# Patient Record
Sex: Female | Born: 1965 | Race: Black or African American | Hispanic: No | Marital: Married | Smoking: Never smoker
Health system: Southern US, Community
[De-identification: ages and names within clinical notes are randomized; demographics above are authoritative.]

---

## 1998-09-10 ENCOUNTER — Other Ambulatory Visit: Admission: RE | Admit: 1998-09-10 | Discharge: 1998-09-10 | Payer: Self-pay | Admitting: *Deleted

## 1999-03-01 HISTORY — PX: ABDOMINAL HYSTERECTOMY: SHX81

## 2000-03-01 ENCOUNTER — Other Ambulatory Visit: Admission: RE | Admit: 2000-03-01 | Discharge: 2000-03-01 | Payer: Self-pay | Admitting: Obstetrics and Gynecology

## 2000-03-21 ENCOUNTER — Other Ambulatory Visit: Admission: RE | Admit: 2000-03-21 | Discharge: 2000-03-21 | Payer: Self-pay | Admitting: Obstetrics and Gynecology

## 2000-03-31 ENCOUNTER — Inpatient Hospital Stay (HOSPITAL_COMMUNITY): Admission: AD | Admit: 2000-03-31 | Discharge: 2000-03-31 | Payer: Self-pay | Admitting: Obstetrics and Gynecology

## 2001-01-09 ENCOUNTER — Observation Stay (HOSPITAL_COMMUNITY): Admission: RE | Admit: 2001-01-09 | Discharge: 2001-01-10 | Payer: Self-pay | Admitting: Obstetrics and Gynecology

## 2001-12-17 ENCOUNTER — Other Ambulatory Visit: Admission: RE | Admit: 2001-12-17 | Discharge: 2001-12-17 | Payer: Self-pay | Admitting: Obstetrics and Gynecology

## 2004-08-25 ENCOUNTER — Ambulatory Visit: Payer: Self-pay | Admitting: Internal Medicine

## 2008-12-12 ENCOUNTER — Ambulatory Visit: Payer: Self-pay | Admitting: Internal Medicine

## 2013-08-09 ENCOUNTER — Ambulatory Visit: Payer: Self-pay | Admitting: Physician Assistant

## 2014-05-01 ENCOUNTER — Ambulatory Visit: Payer: Self-pay | Admitting: Emergency Medicine

## 2018-03-20 ENCOUNTER — Other Ambulatory Visit: Payer: Self-pay

## 2018-03-20 ENCOUNTER — Ambulatory Visit
Admission: EM | Admit: 2018-03-20 | Discharge: 2018-03-20 | Disposition: A | Discharge status: Home / Self Care | Payer: Self-pay | Attending: Family Medicine | Admitting: Family Medicine

## 2018-03-20 ENCOUNTER — Encounter: Payer: Self-pay | Admitting: Emergency Medicine

## 2018-03-20 ENCOUNTER — Emergency Department: Payer: Self-pay

## 2018-03-20 ENCOUNTER — Emergency Department
Admission: EM | Admit: 2018-03-20 | Discharge: 2018-03-20 | Disposition: A | Discharge status: Home / Self Care | Payer: Self-pay | Attending: Emergency Medicine | Admitting: Emergency Medicine

## 2018-03-20 DIAGNOSIS — B353 Tinea pedis: Secondary | ICD-10-CM | POA: Insufficient documentation

## 2018-03-20 DIAGNOSIS — S91301A Unspecified open wound, right foot, initial encounter: Secondary | ICD-10-CM | POA: Insufficient documentation

## 2018-03-20 DIAGNOSIS — L03115 Cellulitis of right lower limb: Secondary | ICD-10-CM | POA: Insufficient documentation

## 2018-03-20 LAB — BASIC METABOLIC PANEL
ANION GAP: 7 (ref 5–15)
BUN: 11 mg/dL (ref 6–20)
CALCIUM: 9.2 mg/dL (ref 8.9–10.3)
CO2: 28 mmol/L (ref 22–32)
CREATININE: 0.9 mg/dL (ref 0.44–1.00)
Chloride: 106 mmol/L (ref 98–111)
GFR calc Af Amer: >60 mL/min (ref >60)
GLUCOSE: 97 mg/dL (ref 70–99)
Potassium: 3.5 mmol/L (ref 3.5–5.1)
Sodium: 141 mmol/L (ref 135–145)

## 2018-03-20 LAB — CBC WITH DIFFERENTIAL/PLATELET
Abs Immature Granulocytes: 0.01 10*3/uL (ref 0.00–0.07)
BASOS ABS: 0 10*3/uL (ref 0.0–0.1)
BASOS PCT: 0 %
Eosinophils Absolute: 0.1 10*3/uL (ref 0.0–0.5)
Eosinophils Relative: 2 %
HCT: 39.8 % (ref 36.0–46.0)
Hemoglobin: 12.3 g/dL (ref 12.0–15.0)
IMMATURE GRANULOCYTES: 0 %
Lymphocytes Relative: 31 %
Lymphs Abs: 1.8 10*3/uL (ref 0.7–4.0)
MCH: 26.6 pg (ref 26.0–34.0)
MCHC: 30.9 g/dL (ref 30.0–36.0)
MCV: 86.1 fL (ref 80.0–100.0)
MONOS PCT: 6 %
Monocytes Absolute: 0.3 10*3/uL (ref 0.1–1.0)
NEUTROS PCT: 61 %
NRBC: 0 % (ref 0.0–0.2)
Neutro Abs: 3.5 10*3/uL (ref 1.7–7.7)
PLATELETS: 423 10*3/uL — AB (ref 150–400)
RBC: 4.62 MIL/uL (ref 3.87–5.11)
RDW: 13.3 % (ref 11.5–15.5)
WBC: 5.8 10*3/uL (ref 4.0–10.5)

## 2018-03-20 LAB — URIC ACID: Uric Acid, Serum: 6.3 mg/dL (ref 2.5–7.1)

## 2018-03-20 MED ORDER — SULFAMETHOXAZOLE-TRIMETHOPRIM 800-160 MG PO TABS
1.0000 | ORAL_TABLET | Freq: Once | ORAL | Status: AC
  Administered 2018-03-20: 1 via ORAL
  Filled 2018-03-20: qty 1

## 2018-03-20 MED ORDER — CLOTRIMAZOLE 1 % EX CREA
TOPICAL_CREAM | Freq: Two times a day (BID) | CUTANEOUS | Status: DC
  Filled 2018-03-20: qty 15

## 2018-03-20 MED ORDER — SULFAMETHOXAZOLE-TRIMETHOPRIM 800-160 MG PO TABS: 1.0000 | ORAL_TABLET | Freq: Two times a day (BID) | ORAL | 0 refills | Status: AC

## 2018-03-20 MED ORDER — CLOTRIMAZOLE 1 % EX CREA: 1.0000 "application " | TOPICAL_CREAM | Freq: Two times a day (BID) | CUTANEOUS | 0 refills | Status: AC

## 2018-03-20 NOTE — ED Triage Notes (Signed)
Here for right foot.  Has had pain to right foot for 1 week and drainage for 1 day.  Sent by urgent care for infection.  No fevers.  Per pt drainage is white.  Pt is not diabetic.  Pt has redness to right great toe. Drainage between first and second toe on right foot. Serous type drainage noted.  Mild warmth to red area.  No history of gout.

## 2018-03-20 NOTE — ED Triage Notes (Signed)
Patient complains of right big toe pain and 2nd toe pain. Patient states that this started 1 week ago. Patient states that area has been swollen and painful.

## 2018-03-20 NOTE — Discharge Instructions (Addendum)
Go directly to emergency room as discussed.  °

## 2018-03-20 NOTE — ED Provider Notes (Signed)
[facility] ____________________________________________  Time seen: Approximately 11:26 AM  I have reviewed the triage vital signs and the nursing notes.   HISTORY  Chief Complaint Toe Pain   HPI Laura Gardner is a 53 y.o. female presenting with family at bedside for evaluation of right foot pain and redness present for approximately 1 week.  Patient reports this has been gradually increasing over the last day or 2.  States they have been soaking in Epson salt without change.  Denies other alleviating measures.  Denies fall, injury or direct trauma.  Denies any known break in skin.  Denies drainage.  No accompanying fevers.  States has occasionally had some tingling sensation coming up her right shin but no pain to the upper leg.  States she has bilateral lower extremity chronic edema without any change in her chronic swelling.  Denies recent sickness.  Denies chest pain, shortness of breath.  Possible history of MRSA per patient.  Denies other skin changes.  Denies history of diabetes  No PCP    History reviewed. No pertinent past medical history.  There are no active problems to display for this patient.   Past Surgical History:  Procedure Laterality Date  . ABDOMINAL HYSTERECTOMY  2001     No current facility-administered medications for this encounter.  No current outpatient medications on file.  Allergies Patient has no known allergies.  Family History  Problem Relation Age of Onset  . Arthritis Mother   . Sleep apnea Father   . Hypertension Father     Social History Social History   Tobacco Use  . Smoking status: Never Smoker  . Smokeless tobacco: Never Used  Substance Use Topics  . Alcohol use: Not Currently  . Drug use: Not Currently    Review of Systems Constitutional: No fever Cardiovascular: Denies chest pain. Respiratory: Denies shortness of breath. Musculoskeletal: Negative for back pain. Skin: Redness right  foot  ____________________________________________   PHYSICAL EXAM:  VITAL SIGNS: ED Triage Vitals  Enc Vitals Group     BP 03/20/18 1055 (!) 169/99     Pulse Rate 03/20/18 1055 79     Resp 03/20/18 1055 18     Temp 03/20/18 1055 98.1 F (36.7 C)     Temp Source 03/20/18 1055 Oral     SpO2 03/20/18 1055 100 %     Weight 03/20/18 1053 (!) 330 lb (149.7 kg)     Height 03/20/18 1053 5\' 6"  (1.676 m)     Head Circumference --      Peak Flow --      Pain Score 03/20/18 1052 10     Pain Loc --      Pain Edu? --      Excl. in GC? --     Constitutional: Alert and oriented. Well appearing and in no acute distress. ENT      Head: Normocephalic and atraumatic. Cardiovascular: Normal rate, regular rhythm. Grossly normal heart sounds.  Good peripheral circulation. Respiratory: Normal respiratory effort without tachypnea nor retractions. Breath sounds are clear and equal bilaterally. No wheezes, rales, rhonchi. Gastrointestinal: Soft and nontender. No distention. Normal Bowel sounds. No CVA tenderness. Musculoskeletal: Ambulates with cane. Except: Right dorsal distal foot at base of first and second toe area of moderate erythema with localized edema, maceration noted between toes and plantar aspect of foot active purulent drainage present with diffuse moderate tenderness to the distal foot, normal sensation per patient, right ankle and right lower extremity otherwise nontender.  Bilateral  lower extremity edema noted. Neurologic:  Normal speech and language. Speech is normal.  Skin:  Skin is warm, dry Psychiatric: Mood and affect are normal. Speech and behavior are normal. Patient exhibits appropriate insight and judgment   ___________________________________________   LABS (all labs ordered are listed, but only abnormal results are displayed)  Labs Reviewed - No data to display   PROCEDURES Procedures    INITIAL IMPRESSION / ASSESSMENT AND PLAN / ED COURSE  Pertinent labs &  imaging results that were available during my care of the patient were reviewed by me and considered in my medical decision making (see chart for details).  Patient with right foot pain, appearance of cellulitis and concern for deeper wound and infection.  Due to appearance recommend further evaluation and treatment in emergency room at this time.  Patient agrees with this plan and reports family will take her to [facility]. ____________________________________________   FINAL CLINICAL IMPRESSION(S) / ED DIAGNOSES  Final diagnoses:  Open wound of right foot, initial encounter  Cellulitis of right foot     ED Discharge Orders    None       Note: This dictation was prepared with Dragon dictation along with smaller phrase technology. Any transcriptional errors that result from this process are unintentional.         Renford Dills, NP 03/20/18 1130

## 2018-03-20 NOTE — ED Notes (Signed)
AAOx3.  Skin warm and dry. nAD 

## 2018-03-20 NOTE — ED Triage Notes (Signed)
Sent for draining toe.

## 2018-03-27 NOTE — ED Provider Notes (Signed)
[facility] Emergency Department Provider Note   None    (approximate)  I have reviewed the triage vital signs and the nursing notes.   HISTORY  Chief Complaint Foot Problem    HPI Laura Gardner is a 53 y.o. female with history of chronic lower extremity edema presents to the emergency department secondary to right foot pain and redness times approximately 1 week.  Patient states that symptoms have progressively worsened over the past 2 days which prompted her visit to the urgent care earlier today.  Patient was referred to the emergency department from urgent care secondary to beforementioned.  Patient states that she has been soaking her foot in Epson salt without any relief.  Patient denies any trauma   Past medical history Chronic lower extremity swelling There are no active problems to display for this patient.   Past Surgical History:  Procedure Laterality Date  . ABDOMINAL HYSTERECTOMY  2001    Prior to Admission medications   Medication Sig Start Date End Date Taking? Authorizing Provider  clotrimazole (LOTRIMIN) 1 % cream Apply 1 application topically 2 (two) times daily. 03/20/18   Darci CurrentBrown, [place] N, MD  sulfamethoxazole-trimethoprim (BACTRIM DS,SEPTRA DS) 800-160 MG tablet Take 1 tablet by mouth 2 (two) times daily for 10 days. 03/20/18 03/30/18  Darci CurrentBrown, [place] N, MD    Allergies Patient has no known allergies.  Family History  Problem Relation Age of Onset  . Arthritis Mother   . Sleep apnea Father   . Hypertension Father     Social History Social History   Tobacco Use  . Smoking status: Never Smoker  . Smokeless tobacco: Never Used  Substance Use Topics  . Alcohol use: Not Currently  . Drug use: Not Currently    Review of Systems Constitutional: No fever/chills Eyes: No visual changes. ENT: No sore throat. Cardiovascular: Denies chest pain. Respiratory: Denies shortness of breath. Gastrointestinal: No  abdominal pain.  No nausea, no vomiting.  No diarrhea.  No constipation. Genitourinary: Negative for dysuria. Musculoskeletal: Negative for neck pain.  Negative for back pain.  Positive for right foot pain swelling and redness Integumentary: Negative for rash. Neurological: Negative for headaches, focal weakness or numbness.   ____________________________________________   PHYSICAL EXAM:  VITAL SIGNS: ED Triage Vitals  Enc Vitals Group     BP 03/20/18 1311 (!) 151/91     Pulse Rate 03/20/18 1432 74     Resp 03/20/18 1432 16     Temp 03/20/18 1311 98.1 F (36.7 C)     Temp Source 03/20/18 1311 Oral     SpO2 03/20/18 1432 100 %     Weight 03/20/18 1310 (!) 149.7 kg (330 lb)     Height 03/20/18 1310 1.676 m (5\' 6" )     Head Circumference --      Peak Flow --      Pain Score 03/20/18 1310 10     Pain Loc --      Pain Edu? --      Excl. in GC? --     Constitutional: Alert and oriented. Well appearing and in no acute distress. Eyes: Conjunctivae are normal.  Mouth/Throat: Mucous membranes are moist.  Oropharynx non-erythematous. Neck: No stridor.  Cardiovascular: Normal rate, regular rhythm. Good peripheral circulation. Grossly normal heart sounds. Respiratory: Normal respiratory effort.  No retractions. Lungs CTAB. Gastrointestinal: Soft and nontender. No distention.  Musculoskeletal: Edema dorsal aspect of the right foot predominantly at the base of the first and second  toe with associated blanching erythema.  Macerated skin noted between the toes Neurologic:  Normal speech and language. No gross focal neurologic deficits are appreciated.  Skin: Edema dorsal aspect of the right foot with associated blanching erythema maceration noted between the toes.  ____________________________________________   LABS (all labs ordered are listed, but only abnormal results are displayed)  Labs Reviewed  CBC WITH DIFFERENTIAL/PLATELET - Abnormal; Notable for the following components:       Result Value   Platelets 423 (*)    All other components within normal limits  BASIC METABOLIC PANEL  URIC ACID   __________________  RADIOLOGY I, Hoyt N , personally viewed and evaluated these images (plain radiographs) as part of my medical decision making, as well as reviewing the written report by the radiologist.  ED MD interpretation: No acute osseous abnormality noted on right foot x-ray per radiologist.  Official radiology report(s): No results found.    Procedures   ____________________________________________   INITIAL IMPRESSION / ASSESSMENT AND PLAN / ED COURSE  As part of my medical decision making, I reviewed the following data within the electronic MEDICAL RECORD NUMBER   53 year old female presenting with above-stated history and physical exam consistent with athlete's foot with superimposed right foot cellulitis.  Patient given Bactrim DS and clotrimazole.  I spoke with the patient at length regarding warning signs that would warrant immediate return to the emergency department for further evaluation. ____________________________________________  FINAL CLINICAL IMPRESSION(S) / ED DIAGNOSES  Final diagnoses:  Cellulitis of foot, right  Tinea pedis of right foot     MEDICATIONS GIVEN DURING THIS VISIT:  Medications  sulfamethoxazole-trimethoprim (BACTRIM DS,SEPTRA DS) 800-160 MG per tablet 1 tablet (1 tablet Oral Given 03/20/18 1359)     ED Discharge Orders         Ordered    sulfamethoxazole-trimethoprim (BACTRIM DS,SEPTRA DS) 800-160 MG tablet  2 times daily     03/20/18 1425    clotrimazole (LOTRIMIN) 1 % cream  2 times daily     03/20/18 1425           Note:  This document was prepared using Dragon voice recognition software and may include unintentional dictation errors.    Darci Current, MD 03/27/18 [phone]

## 2018-07-11 ENCOUNTER — Ambulatory Visit: Payer: Self-pay

## 2019-10-30 ENCOUNTER — Other Ambulatory Visit: Payer: Self-pay

## 2019-10-30 ENCOUNTER — Emergency Department (HOSPITAL_COMMUNITY)
Admission: EM | Admit: 2019-10-30 | Discharge: 2019-10-30 | Disposition: A | Discharge status: Home / Self Care | Payer: Self-pay | Attending: Emergency Medicine | Admitting: Emergency Medicine

## 2019-10-30 ENCOUNTER — Encounter (HOSPITAL_COMMUNITY): Payer: Self-pay

## 2019-10-30 ENCOUNTER — Emergency Department (HOSPITAL_COMMUNITY): Payer: Self-pay

## 2019-10-30 DIAGNOSIS — K5792 Diverticulitis of intestine, part unspecified, without perforation or abscess without bleeding: Secondary | ICD-10-CM | POA: Insufficient documentation

## 2019-10-30 DIAGNOSIS — Z79899 Other long term (current) drug therapy: Secondary | ICD-10-CM | POA: Insufficient documentation

## 2019-10-30 LAB — URINALYSIS, ROUTINE W REFLEX MICROSCOPIC
Bacteria, UA: NONE SEEN
Bilirubin Urine: NEGATIVE
Glucose, UA: NEGATIVE mg/dL
Ketones, ur: NEGATIVE mg/dL
Leukocytes,Ua: NEGATIVE
Nitrite: NEGATIVE
Protein, ur: NEGATIVE mg/dL
Specific Gravity, Urine: 1.024 (ref 1.005–1.030)
pH: 6 (ref 5.0–8.0)

## 2019-10-30 LAB — CBC
HCT: 39.9 % (ref 36.0–46.0)
Hemoglobin: 12.1 g/dL (ref 12.0–15.0)
MCH: 26.9 pg (ref 26.0–34.0)
MCHC: 30.3 g/dL (ref 30.0–36.0)
MCV: 88.7 fL (ref 80.0–100.0)
Platelets: 442 10*3/uL — ABNORMAL HIGH (ref 150–400)
RBC: 4.5 MIL/uL (ref 3.87–5.11)
RDW: 13.9 % (ref 11.5–15.5)
WBC: 13 10*3/uL — ABNORMAL HIGH (ref 4.0–10.5)
nRBC: 0 % (ref 0.0–0.2)

## 2019-10-30 LAB — COMPREHENSIVE METABOLIC PANEL
ALT: 13 U/L (ref 0–44)
AST: 11 U/L — ABNORMAL LOW (ref 15–41)
Albumin: 3.7 g/dL (ref 3.5–5.0)
Alkaline Phosphatase: 60 U/L (ref 38–126)
Anion gap: 12 (ref 5–15)
BUN: 10 mg/dL (ref 6–20)
CO2: 25 mmol/L (ref 22–32)
Calcium: 8.8 mg/dL — ABNORMAL LOW (ref 8.9–10.3)
Chloride: 102 mmol/L (ref 98–111)
Creatinine, Ser: 0.85 mg/dL (ref 0.44–1.00)
GFR calc Af Amer: >60 mL/min (ref >60)
GFR calc non Af Amer: >60 mL/min (ref >60)
Glucose, Bld: 94 mg/dL (ref 70–99)
Potassium: 3.2 mmol/L — ABNORMAL LOW (ref 3.5–5.1)
Sodium: 139 mmol/L (ref 135–145)
Total Bilirubin: 0.8 mg/dL (ref 0.3–1.2)
Total Protein: 8.5 g/dL — ABNORMAL HIGH (ref 6.5–8.1)

## 2019-10-30 LAB — LIPASE, BLOOD: Lipase: 33 U/L (ref 11–51)

## 2019-10-30 MED ORDER — HYDROCODONE-ACETAMINOPHEN 5-325 MG PO TABS
1.0000 | ORAL_TABLET | Freq: Once | ORAL | Status: AC
  Administered 2019-10-30: 1 via ORAL
  Filled 2019-10-30: qty 1

## 2019-10-30 MED ORDER — METRONIDAZOLE 500 MG PO TABS: 500.0000 mg | ORAL_TABLET | Freq: Two times a day (BID) | ORAL | 0 refills | Status: AC

## 2019-10-30 MED ORDER — HYDROCODONE-ACETAMINOPHEN 5-325 MG PO TABS: ORAL_TABLET | ORAL | 0 refills | Status: AC

## 2019-10-30 MED ORDER — ONDANSETRON 8 MG PO TBDP
8.0000 mg | ORAL_TABLET | Freq: Once | ORAL | Status: AC
  Administered 2019-10-30: 8 mg via ORAL
  Filled 2019-10-30: qty 1

## 2019-10-30 MED ORDER — CIPROFLOXACIN HCL 500 MG PO TABS: 500.0000 mg | ORAL_TABLET | Freq: Two times a day (BID) | ORAL | 0 refills | Status: DC

## 2019-10-30 MED ORDER — CIPROFLOXACIN HCL 250 MG PO TABS
500.0000 mg | ORAL_TABLET | Freq: Once | ORAL | Status: AC
  Administered 2019-10-30: 500 mg via ORAL
  Filled 2019-10-30: qty 2

## 2019-10-30 MED ORDER — IOHEXOL 300 MG/ML  SOLN
100.0000 mL | Freq: Once | INTRAMUSCULAR | Status: AC | PRN
  Administered 2019-10-30: 100 mL via INTRAVENOUS

## 2019-10-30 MED ORDER — METRONIDAZOLE 500 MG PO TABS
500.0000 mg | ORAL_TABLET | Freq: Once | ORAL | Status: AC
  Administered 2019-10-30: 500 mg via ORAL
  Filled 2019-10-30: qty 1

## 2019-10-30 NOTE — ED Triage Notes (Signed)
Pt presents to ED with complaints of lower abdominal cramping x 2 days, vomited x 1. Last BM yesterday.

## 2019-10-30 NOTE — ED Provider Notes (Signed)
[facility] EMERGENCY DEPARTMENT Provider Note   CSN: [id number] Arrival date & time: 10/30/19  1350     History Chief Complaint  Patient presents with  . Abdominal Pain    Laura Gardner is a 54 y.o. female.  HPI      Laura Gardner is a 54 y.o. female who presents to the Emergency Department complaining of diffuse lower abdominal pain for 2 days. Initially, pain was waxing and waning and became constant earlier today.  Her pain is been associated with nausea and she had one episode of vomiting earlier today.  No exacerbating or alleviating factors.  She states her bowel movements have been normal and last BM was yesterday.  She denies fever, chills, dysuria, back pain, chest pain or shortness of breath.  Surgical history includes an abdominal hysterectomy.    History reviewed. No pertinent past medical history.  There are no problems to display for this patient.   Past Surgical History:  Procedure Laterality Date  . ABDOMINAL HYSTERECTOMY  2001     OB History   No obstetric history on file.     Family History  Problem Relation Age of Onset  . Arthritis Mother   . Sleep apnea Father   . Hypertension Father     Social History   Tobacco Use  . Smoking status: Never Smoker  . Smokeless tobacco: Never Used  Vaping Use  . Vaping Use: Never used  Substance Use Topics  . Alcohol use: Not Currently  . Drug use: Not Currently    Home Medications Prior to Admission medications   Medication Sig Start Date End Date Taking? Authorizing Provider  clotrimazole (LOTRIMIN) 1 % cream Apply 1 application topically 2 (two) times daily. 03/20/18   Darci Current, MD    Allergies    Patient has no known allergies.  Review of Systems   Review of Systems  Constitutional: Negative for appetite change, chills and fever.  Respiratory: Negative for shortness of breath.   Cardiovascular: Negative for chest pain.  Gastrointestinal: Positive for  abdominal pain, nausea and vomiting. Negative for blood in stool, constipation and diarrhea.  Genitourinary: Negative for decreased urine volume, difficulty urinating, dysuria and flank pain.  Musculoskeletal: Negative for back pain.  Skin: Negative for color change and rash.  Neurological: Negative for dizziness, weakness and numbness.  Hematological: Negative for adenopathy.    Physical Exam Updated Vital Signs BP (!) 155/82 (BP Location: Left Arm)   Pulse 90   Temp 99.1 F (37.3 C) (Oral)   Resp 18   Ht 5' 6.5" (1.689 m)   Wt (!) 145.2 kg   SpO2 100%   BMI 50.88 kg/m   Physical Exam Vitals and nursing note reviewed.  Constitutional:      General: She is not in acute distress.    Appearance: She is well-developed.  HENT:     Head: Normocephalic and atraumatic.     Mouth/Throat:     Mouth: Mucous membranes are moist.  Cardiovascular:     Rate and Rhythm: Normal rate and regular rhythm.     Pulses: Normal pulses.  Pulmonary:     Effort: Pulmonary effort is normal. No respiratory distress.     Breath sounds: Normal breath sounds.  Abdominal:     General: Bowel sounds are normal. There is no distension.     Palpations: Abdomen is soft. There is no mass.     Tenderness: There is abdominal tenderness. There is no guarding or rebound.  Comments: Diffuse tenderness of the lower abdomen.  No guarding or rebound.  Abdomen is soft.  No CVA tenderness.  Musculoskeletal:        General: Normal range of motion.  Skin:    General: Skin is warm.     Capillary Refill: Capillary refill takes less than 2 seconds.     Findings: No rash.  Neurological:     General: No focal deficit present.     Mental Status: She is alert and oriented to person, place, and time.     Sensory: No sensory deficit.     Motor: No weakness or abnormal muscle tone.     ED Results / Procedures / Treatments   Labs (all labs ordered are listed, but only abnormal results are displayed) Labs Reviewed    COMPREHENSIVE METABOLIC PANEL - Abnormal; Notable for the following components:      Result Value   Potassium 3.2 (*)    Calcium 8.8 (*)    Total Protein 8.5 (*)    AST 11 (*)    All other components within normal limits  CBC - Abnormal; Notable for the following components:   WBC 13.0 (*)    Platelets 442 (*)    All other components within normal limits  URINALYSIS, ROUTINE W REFLEX MICROSCOPIC - Abnormal; Notable for the following components:   Hgb urine dipstick MODERATE (*)    All other components within normal limits  LIPASE, BLOOD    EKG None  Radiology CT ABDOMEN PELVIS W CONTRAST  Result Date: 10/30/2019 CLINICAL DATA:  54 year old presenting with acute onset of LOWER abdominal cramping pain that began 2 days ago, associated with 1 episode of vomiting. Surgical history includes hysterectomy. EXAM: CT ABDOMEN AND PELVIS WITH CONTRAST TECHNIQUE: Multidetector CT imaging of the abdomen and pelvis was performed using the standard protocol following bolus administration of intravenous contrast. CONTRAST:  OMNIPAQUE IOHEXOL 300 MG/ML IV. COMPARISON:  None. FINDINGS: Lower chest: Respiratory motion blurred images of the lung bases. Heart size normal. Visualized lung bases clear. Eventration of the RIGHT anterior hemidiaphragm. Hepatobiliary: Liver normal in size and appearance. Gallbladder normal in appearance without calcified gallstones. No biliary ductal dilation. Pancreas: Normal in appearance without evidence of mass, ductal dilation, or inflammation. Spleen: Normal in size and appearance. Adrenals/Urinary Tract: Normal appearing adrenal glands. Kidneys normal in size and appearance without focal parenchymal abnormality. No hydronephrosis. No evidence of urinary tract calculi. Normal appearing decompressed urinary bladder. Stomach/Bowel: Stomach normal in appearance for the degree of distention. Normal-appearing small bowel. Diverticulosis involving the descending and sigmoid  colon with evidence of acute diverticulitis involving the proximal sigmoid colon. No evidence of extraluminal gas or abscess. Remainder of the colon unremarkable. Normal appendix in the RIGHT upper pelvis. Vascular/Lymphatic: No visible aortoiliofemoral atherosclerosis. Widely patent visceral arteries. Normal-appearing portal venous and systemic venous systems. No pathologic lymphadenopathy. Reproductive: Surgically absent uterus. No adnexal masses. It appears that both ovaries remain. Other: Umbilical hernia containing fat. Musculoskeletal: Mild to moderate multifactorial spinal stenosis at L4-5. No acute findings. IMPRESSION: 1. Acute diverticulitis involving the proximal sigmoid colon. No evidence of perforation or abscess. 2. Umbilical hernia containing fat. 3. Mild to moderate multifactorial spinal stenosis at L4-5. Electronically Signed   By: Hulan Saas M.D.   On: 10/30/2019 20:10    Procedures Procedures (including critical care time)  Medications Ordered in ED Medications  HYDROcodone-acetaminophen (NORCO/VICODIN) 5-325 MG per tablet 1 tablet (has no administration in time range)  ciprofloxacin (CIPRO) tablet 500 mg (has  no administration in time range)  metroNIDAZOLE (FLAGYL) tablet 500 mg (has no administration in time range)  iohexol (OMNIPAQUE) 300 MG/ML solution 100 mL (100 mLs Intravenous Contrast Given 10/30/19 1950)    ED Course  I have reviewed the triage vital signs and the nursing notes.  Pertinent labs & imaging results that were available during my care of the patient were reviewed by me and considered in my medical decision making (see chart for details).    MDM Rules/Calculators/A&P                          Patient here with 2-day history of waxing and waning lower abdominal pain.  Pain became persistent today.  She is well-appearing nontoxic.  Afebrile.  On my exam, she does have diffuse tenderness to the lower abdomen.  No peritoneal signs.  Will obtain labs and CT  of the abdomen pelvis.  Laboratory values interpreted by me, she is mild leukocytosis of 13, no left shift. Lipase unremarkable, UA without evidence of infection, electrolytes show mild hypokalemia but otherwise unremarkable.  CT scan shows acute diverticulitis without abscess or perforation.  Patient is otherwise well-appearing, I feel that she is appropriate for discharge home.  We will start oral antibiotics.  She does not currently have PCP and has not had a baseline colonoscopy.  I will provide follow-up information for GI.  She was given strict return precautions as well.  Final Clinical Impression(s) / ED Diagnoses Final diagnoses:  Acute diverticulitis    Rx / DC Orders ED Discharge Orders    None       Rosey Bath 10/30/19 2103    Bethann Berkshire, MD 10/31/19 [phone]

## 2019-10-30 NOTE — Discharge Instructions (Addendum)
Your CT scan today shows that you have diverticulitis of your colon.  You have been prescribed antibiotics to take for this.  Is important that you take them as directed until they are finished.  You may find that eating smaller frequent meals is easier on your stomach.  I have provided referral information for the gastroenterologist.  You may call to arrange a follow-up appointment.  Return to the emergency department if you develop any worsening symptoms such as persistent vomiting, increasing pain, fever or chills.

## 2019-10-31 ENCOUNTER — Telehealth (HOSPITAL_COMMUNITY): Payer: Self-pay | Admitting: Emergency Medicine

## 2019-10-31 MED ORDER — CIPROFLOXACIN HCL 500 MG PO TABS: 500.0000 mg | ORAL_TABLET | Freq: Two times a day (BID) | ORAL | 0 refills | Status: DC

## 2019-10-31 MED ORDER — CIPROFLOXACIN HCL 500 MG PO TABS: 500.0000 mg | ORAL_TABLET | Freq: Two times a day (BID) | ORAL | 0 refills | Status: AC

## 2019-10-31 NOTE — Telephone Encounter (Signed)
Pt's initial pharmacy has no ciprofloxacin in stock.  New e script sent to [facility].

## 2019-10-31 NOTE — Telephone Encounter (Signed)
Cipro sent to pt requested pharmacy

## 2021-11-21 IMAGING — CT CT ABD-PELV W/ CM
2 of 5 series · 16 of 46 positions shown, 18 images · IV contrast (Omnipaque or Isovue)
Comparison: None.

CLINICAL DATA: 54-year-old presenting with acute onset of LOWER
abdominal cramping pain that began 2 days ago, associated with 1
episode of vomiting. Surgical history includes hysterectomy.

EXAM:
CT ABDOMEN AND PELVIS WITH CONTRAST
TECHNIQUE: Multidetector CT imaging of the abdomen and pelvis was performed
using the standard protocol following bolus administration of
intravenous contrast.
CONTRAST:  100mL OMNIPAQUE IOHEXOL 300 MG/ML IV.

[Series 3: axial st · axial · 0.83mm/px · z∈[+747,+1122]mm · 13 of 86 slices shown, 15 images]
[im 6/86  soft-tissue]
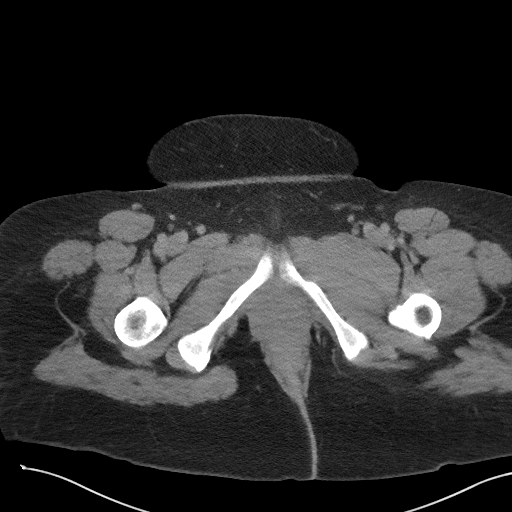
[im 6/86  bone]
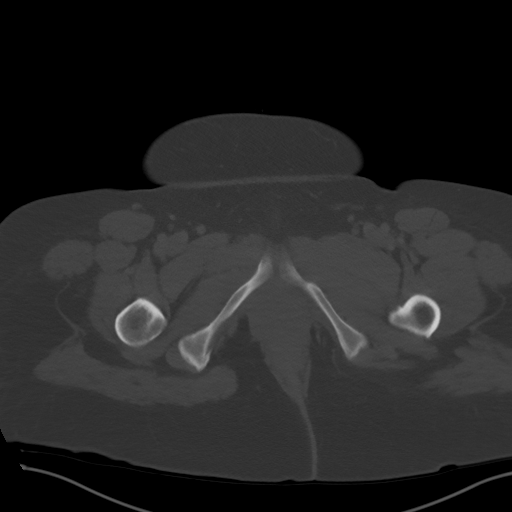
[im 11/86  soft-tissue]
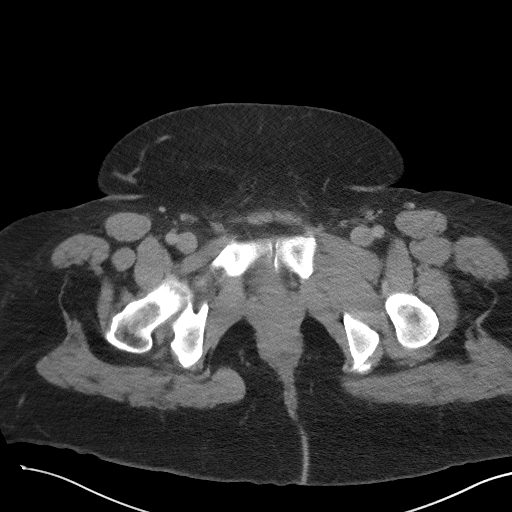
[im 21/86  soft-tissue]
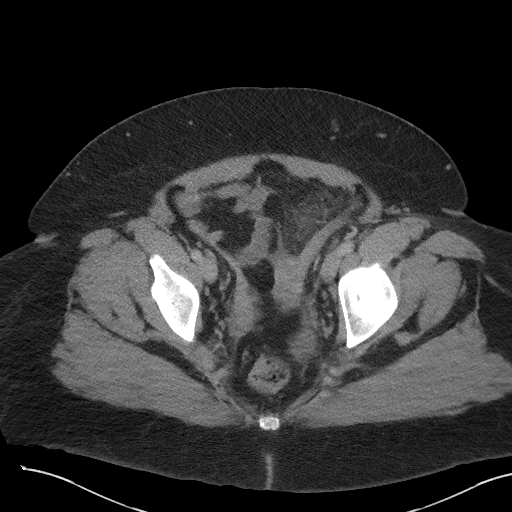
[im 26/86  soft-tissue]
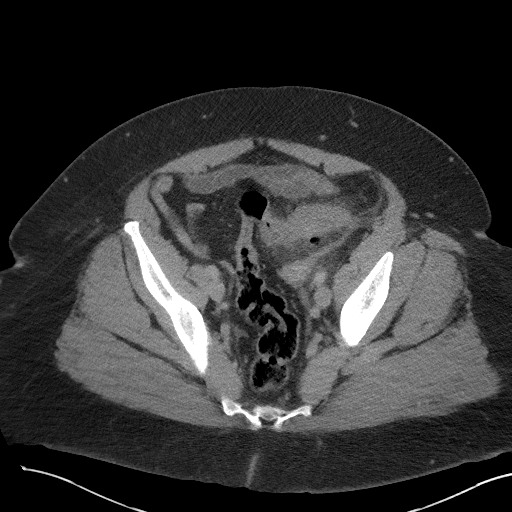
[im 31/86  soft-tissue]
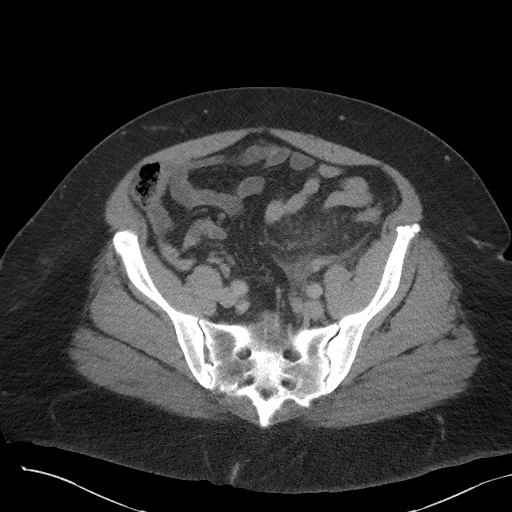
[im 36/86  soft-tissue]
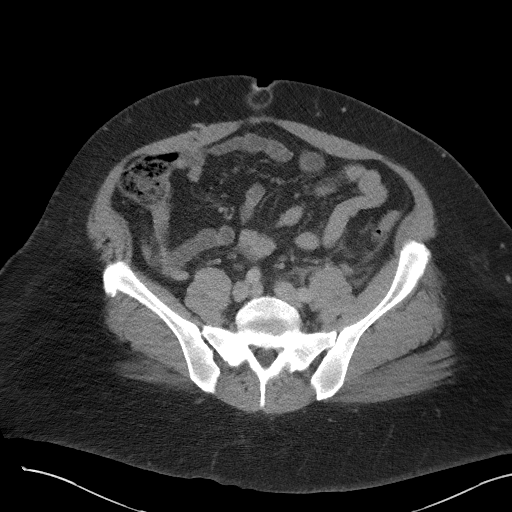
[im 46/86  soft-tissue]
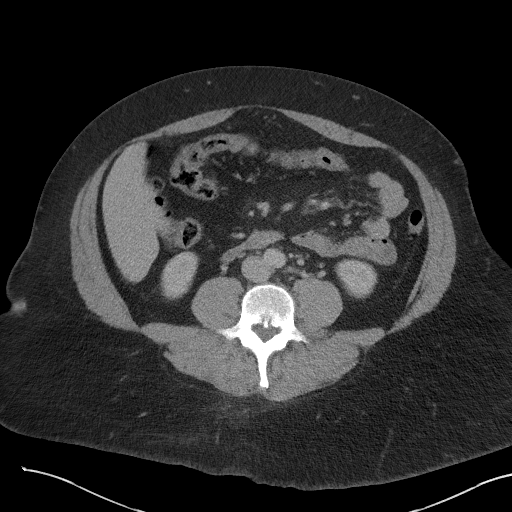
[im 51/86  soft-tissue]
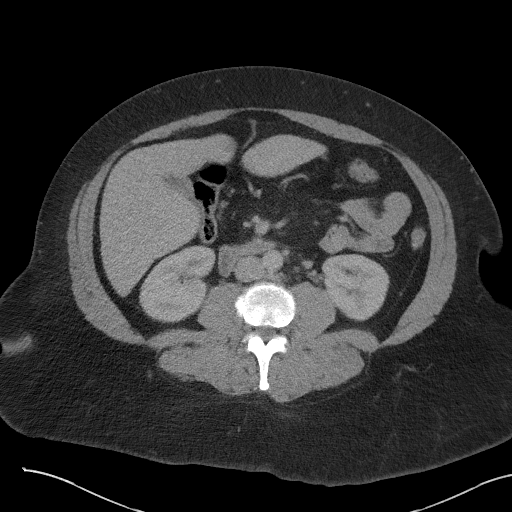
[im 56/86  soft-tissue]
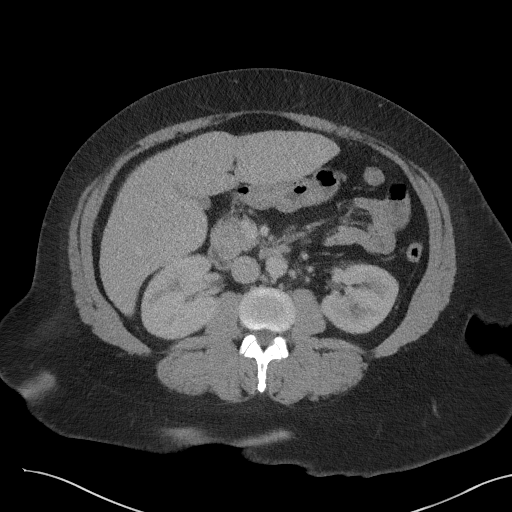
[im 56/86  bone]
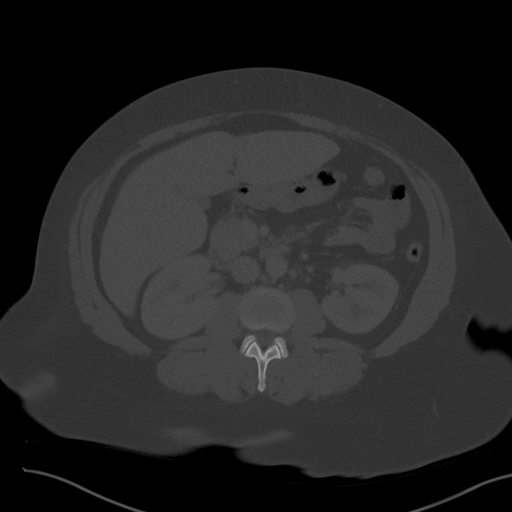
[im 61/86  soft-tissue]
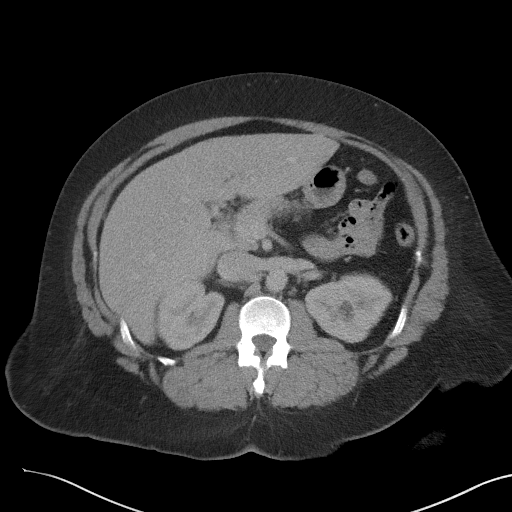
[im 66/86  soft-tissue]
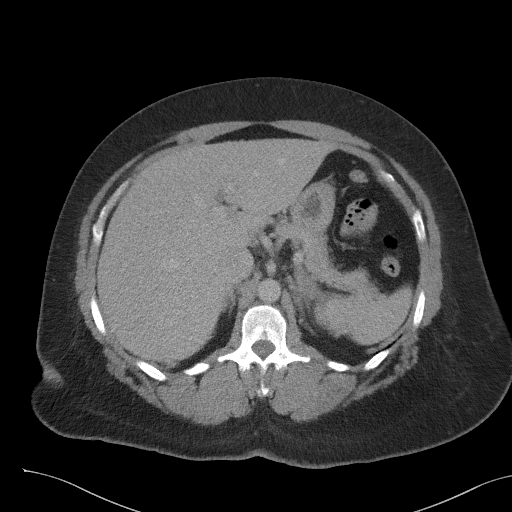
[im 76/86  soft-tissue]
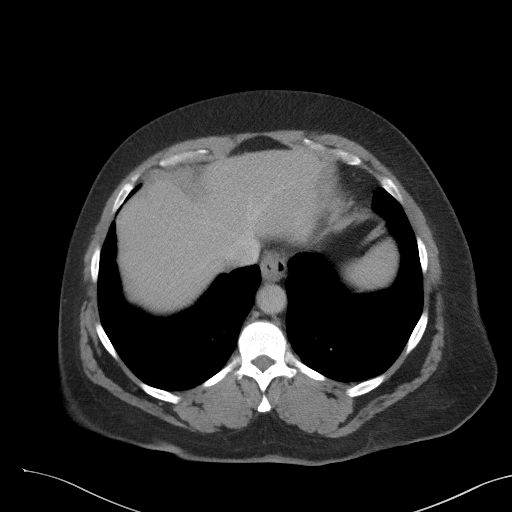
[im 81/86  soft-tissue]
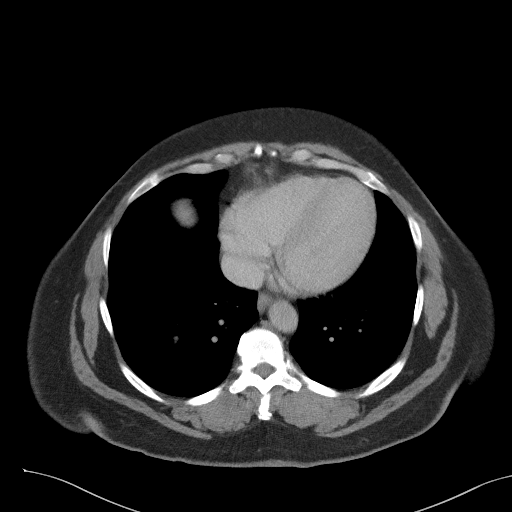

[Series 6: coronal st · coronal · 0.89mm/px · 3 of 124 slices shown]
[im 42/124  soft-tissue]
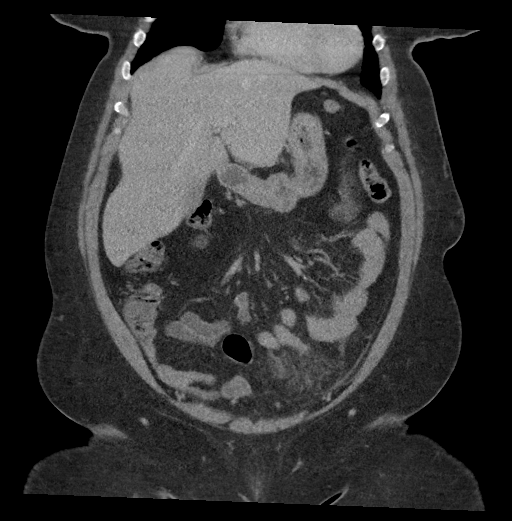
[im 55/124  soft-tissue]
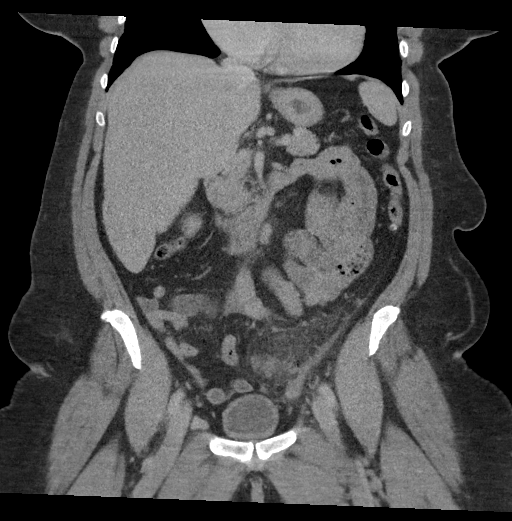
[im 69/124  soft-tissue]
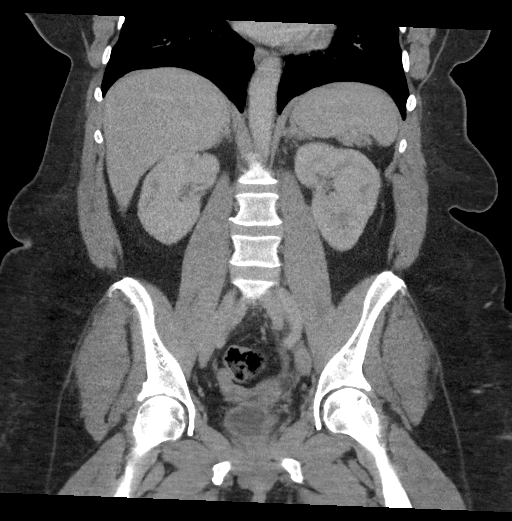

[16 of 46 positions shown; findings below may reference images not displayed]

FINDINGS: Lower chest: Respiratory motion blurred images of the lung bases.
Heart size normal. Visualized lung bases clear. Eventration of the
RIGHT anterior hemidiaphragm.

Hepatobiliary: Liver normal in size and appearance. Gallbladder
normal in appearance without calcified gallstones. No biliary ductal
dilation.

Pancreas: Normal in appearance without evidence of mass, ductal
dilation, or inflammation.

Spleen: Normal in size and appearance.

Adrenals/Urinary Tract: Normal appearing adrenal glands. Kidneys
normal in size and appearance without focal parenchymal abnormality.
No hydronephrosis. No evidence of urinary tract calculi. Normal
appearing decompressed urinary bladder.

Stomach/Bowel: Stomach normal in appearance for the degree of
distention. Normal-appearing small bowel. Diverticulosis involving
the descending and sigmoid colon with evidence of acute
diverticulitis involving the proximal sigmoid colon. No evidence of
extraluminal gas or abscess. Remainder of the colon unremarkable.
Normal appendix in the RIGHT upper pelvis.

Vascular/Lymphatic: No visible aortoiliofemoral atherosclerosis.
Widely patent visceral arteries. Normal-appearing portal venous and
systemic venous systems.

No pathologic lymphadenopathy.

Reproductive: Surgically absent uterus. No adnexal masses. It
appears that both ovaries remain.

Other: Umbilical hernia containing fat.

Musculoskeletal: Mild to moderate multifactorial spinal stenosis at
L4-5. No acute findings.
IMPRESSION: 1. Acute diverticulitis involving the proximal sigmoid colon. No
evidence of perforation or abscess.
2. Umbilical hernia containing fat.
3. Mild to moderate multifactorial spinal stenosis at L4-5.
